# Patient Record
Sex: Female | Born: 1955 | Race: White | Hispanic: No | Marital: Married | Smoking: Never smoker
Health system: Southern US, Community
[De-identification: ages and names within clinical notes are randomized; demographics above are authoritative.]

## PROBLEM LIST (undated history)

## (undated) DIAGNOSIS — I493 Ventricular premature depolarization: Secondary | ICD-10-CM

## (undated) DIAGNOSIS — I10 Essential (primary) hypertension: Secondary | ICD-10-CM

## (undated) DIAGNOSIS — M1990 Unspecified osteoarthritis, unspecified site: Secondary | ICD-10-CM

## (undated) DIAGNOSIS — D649 Anemia, unspecified: Secondary | ICD-10-CM

## (undated) DIAGNOSIS — R51 Headache: Secondary | ICD-10-CM

## (undated) HISTORY — PX: JOINT REPLACEMENT: SHX530

## (undated) HISTORY — PX: TUBAL LIGATION: SHX77

## (undated) HISTORY — PX: BACK SURGERY: SHX140

## (undated) HISTORY — PX: PARATHYROIDECTOMY: SHX19

## (undated) HISTORY — PX: APPENDECTOMY: SHX54

## (undated) HISTORY — PX: FOOT SURGERY: SHX648

## (undated) HISTORY — PX: ABDOMINAL HYSTERECTOMY: SHX81

---

## 2014-01-03 ENCOUNTER — Other Ambulatory Visit: Payer: Self-pay | Admitting: Orthopedic Surgery

## 2014-01-25 ENCOUNTER — Encounter (HOSPITAL_COMMUNITY): Payer: Self-pay | Admitting: Pharmacy Technician

## 2014-01-28 ENCOUNTER — Encounter (HOSPITAL_COMMUNITY): Payer: Self-pay

## 2014-01-28 ENCOUNTER — Encounter (HOSPITAL_COMMUNITY)
Admission: RE | Admit: 2014-01-28 | Discharge: 2014-01-28 | Disposition: A | Discharge status: Home / Self Care | Payer: Medicare HMO | Source: Ambulatory Visit | Attending: Orthopedic Surgery | Admitting: Orthopedic Surgery

## 2014-01-28 DIAGNOSIS — Z01812 Encounter for preprocedural laboratory examination: Secondary | ICD-10-CM | POA: Insufficient documentation

## 2014-01-28 DIAGNOSIS — Z01818 Encounter for other preprocedural examination: Secondary | ICD-10-CM | POA: Insufficient documentation

## 2014-01-28 HISTORY — DX: Unspecified osteoarthritis, unspecified site: M19.90

## 2014-01-28 HISTORY — DX: Ventricular premature depolarization: I49.3

## 2014-01-28 HISTORY — DX: Anemia, unspecified: D64.9

## 2014-01-28 HISTORY — DX: Essential (primary) hypertension: I10

## 2014-01-28 HISTORY — DX: Headache: R51

## 2014-01-28 LAB — URINALYSIS, ROUTINE W REFLEX MICROSCOPIC
BILIRUBIN URINE: NEGATIVE
Glucose, UA: NEGATIVE mg/dL
Hgb urine dipstick: NEGATIVE
Ketones, ur: NEGATIVE mg/dL
LEUKOCYTES UA: NEGATIVE
NITRITE: NEGATIVE
Protein, ur: NEGATIVE mg/dL
SPECIFIC GRAVITY, URINE: 1.018 (ref 1.005–1.030)
UROBILINOGEN UA: 0.2 mg/dL (ref 0.0–1.0)
pH: 5 (ref 5.0–8.0)

## 2014-01-28 LAB — CBC WITH DIFFERENTIAL/PLATELET
BASOS ABS: 0 10*3/uL (ref 0.0–0.1)
BASOS PCT: 1 % (ref 0–1)
EOS ABS: 0.2 10*3/uL (ref 0.0–0.7)
Eosinophils Relative: 4 % (ref 0–5)
HCT: 36.1 % (ref 36.0–46.0)
Hemoglobin: 12.5 g/dL (ref 12.0–15.0)
Lymphocytes Relative: 31 % (ref 12–46)
Lymphs Abs: 1.3 10*3/uL (ref 0.7–4.0)
MCH: 32.5 pg (ref 26.0–34.0)
MCHC: 34.6 g/dL (ref 30.0–36.0)
MCV: 93.8 fL (ref 78.0–100.0)
Monocytes Absolute: 0.3 10*3/uL (ref 0.1–1.0)
Monocytes Relative: 6 % (ref 3–12)
Neutro Abs: 2.5 10*3/uL (ref 1.7–7.7)
Neutrophils Relative %: 58 % (ref 43–77)
PLATELETS: 260 10*3/uL (ref 150–400)
RBC: 3.85 MIL/uL — ABNORMAL LOW (ref 3.87–5.11)
RDW: 12.8 % (ref 11.5–15.5)
WBC: 4.2 10*3/uL (ref 4.0–10.5)

## 2014-01-28 LAB — COMPREHENSIVE METABOLIC PANEL
ALBUMIN: 4.1 g/dL (ref 3.5–5.2)
ALK PHOS: 100 U/L (ref 39–117)
ALT: 29 U/L (ref 0–35)
AST: 29 U/L (ref 0–37)
BUN: 22 mg/dL (ref 6–23)
CHLORIDE: 103 meq/L (ref 96–112)
CO2: 27 mEq/L (ref 19–32)
Calcium: 9.6 mg/dL (ref 8.4–10.5)
Creatinine, Ser: 0.71 mg/dL (ref 0.50–1.10)
GFR calc Af Amer: >90 mL/min (ref >90)
GFR calc non Af Amer: >90 mL/min (ref >90)
Glucose, Bld: 80 mg/dL (ref 70–99)
Potassium: 4.6 mEq/L (ref 3.7–5.3)
SODIUM: 142 meq/L (ref 137–147)
TOTAL PROTEIN: 7.3 g/dL (ref 6.0–8.3)
Total Bilirubin: <0.2 mg/dL — ABNORMAL LOW (ref 0.3–1.2)

## 2014-01-28 LAB — PROTIME-INR
INR: 0.93 (ref 0.00–1.49)
PROTHROMBIN TIME: 12.3 s (ref 11.6–15.2)

## 2014-01-28 LAB — TYPE AND SCREEN
ABO/RH(D): O POS
ANTIBODY SCREEN: NEGATIVE

## 2014-01-28 LAB — APTT: APTT: 33 s (ref 24–37)

## 2014-01-28 LAB — SURGICAL PCR SCREEN
MRSA, PCR: NEGATIVE
STAPHYLOCOCCUS AUREUS: NEGATIVE

## 2014-01-28 NOTE — Pre-Procedure Instructions (Signed)
Alyssa Meza  01/28/2014   Your procedure is scheduled on: Wednesday, February 06, 2014 at 12:09 PM  [facility] (use Main Entrance "A'') at 9:00 AM.  Call this number if you have problems the morning of surgery: [phone]   Remember:   Do not eat food or drink liquids after midnight, Tuesday 02/05/14   Take these medicines the morning of surgery with A SIP OF WATER: carvedilol (COREG) If needed: HYDROcodone-acetaminophen (NORCO for pain  Stop taking Aspirin, Multivitamins and herbal medications. Do not take any NSAIDs ie: Ibuprofen, Advil, Naproxen or any medication containing Aspirin. Stop at least 5 days prior to procedure, 02/01/14.   Do not wear jewelry, make-up or nail polish.  Do not wear lotions, powders, or perfumes. You may wear deodorant.  Do not shave 48 hours prior to surgery.   Do not bring valuables to the hospital.  [facility] is not responsible for any belongings or valuables.               Contacts, dentures or bridgework may not be worn into surgery.  Leave suitcase in the car. After surgery it may be brought to your room.  For patients admitted to the hospital, discharge time is determined by your treatment team.               Patients discharged the day of surgery will not be allowed to drive home.  Name and phone number of your driver:   Special Instructions:  Special Instructions:Special Instructions: [facility] - Preparing for Surgery  Before surgery, you can play an important role.  Because skin is not sterile, your skin needs to be as free of germs as possible.  You can reduce the number of germs on you skin by washing with CHG (chlorahexidine gluconate) soap before surgery.  CHG is an antiseptic cleaner which kills germs and bonds with the skin to continue killing germs even after washing.  Please DO NOT use if you have an allergy to CHG or antibacterial soaps.  If your skin becomes reddened/irritated stop using the CHG and inform your nurse  when you arrive at Short Stay.  Do not shave (including legs and underarms) for at least 48 hours prior to the first CHG shower.  You may shave your face.  Please follow these instructions carefully:   1.  Shower with CHG Soap the night before surgery and the morning of Surgery.  2.  If you choose to wash your hair, wash your hair first as usual with your normal shampoo.  3.  After you shampoo, rinse your hair and body thoroughly to remove the Shampoo.  4.  Use CHG as you would any other liquid soap.  You can apply chg directly  to the skin and wash gently with scrungie or a clean washcloth.  5.  Apply the CHG Soap to your body ONLY FROM THE NECK DOWN.  Do not use on open wounds or open sores.  Avoid contact with your eyes, ears, mouth and genitals (private parts).  Wash genitals (private parts) with your normal soap.  6.  Wash thoroughly, paying special attention to the area where your surgery will be performed.  7.  Thoroughly rinse your body with warm water from the neck down.  8.  DO NOT shower/wash with your normal soap after using and rinsing off the CHG Soap.  9.  Pat yourself dry with a clean towel.  10.  Wear clean pajamas.            11.  Place clean sheets on your bed the night of your first shower and do not sleep with pets.  Day of Surgery  Do not apply any lotions the morning of surgery.  Please wear clean clothes to the hospital/surgery center.   Please read over the following fact sheets that you were given: Pain Booklet, Coughing and Deep Breathing, Blood Transfusion Information, MRSA Information and Surgical Site Infection Prevention

## 2014-01-29 LAB — ABO/RH: ABO/RH(D): O POS

## 2014-01-30 NOTE — Progress Notes (Signed)
Received a return fax from [facility]--"hasn't been seen in office in 3+ yrs" And received office note with ekg- placed in chart.  DA

## 2014-02-05 MED ORDER — CEFAZOLIN SODIUM-DEXTROSE 2-3 GM-% IV SOLR
2.0000 g | INTRAVENOUS | Status: AC
  Administered 2014-02-06: 2 g via INTRAVENOUS
  Filled 2014-02-05: qty 50

## 2014-02-06 ENCOUNTER — Inpatient Hospital Stay (HOSPITAL_COMMUNITY): Payer: Medicare HMO

## 2014-02-06 ENCOUNTER — Encounter (HOSPITAL_COMMUNITY): Payer: Self-pay | Admitting: *Deleted

## 2014-02-06 ENCOUNTER — Inpatient Hospital Stay (HOSPITAL_COMMUNITY)
Admission: RE | Admit: 2014-02-06 | Discharge: 2014-02-07 | DRG: 517 | Disposition: A | Discharge status: Home / Self Care | Payer: Medicare HMO | Source: Ambulatory Visit | Attending: Orthopedic Surgery | Admitting: Orthopedic Surgery

## 2014-02-06 ENCOUNTER — Encounter (HOSPITAL_COMMUNITY)
Admission: RE | Disposition: A | Discharge status: Home / Self Care | Payer: Self-pay | Source: Ambulatory Visit | Attending: Orthopedic Surgery

## 2014-02-06 ENCOUNTER — Inpatient Hospital Stay (HOSPITAL_COMMUNITY): Payer: Medicare HMO | Admitting: Anesthesiology

## 2014-02-06 ENCOUNTER — Encounter (HOSPITAL_COMMUNITY): Payer: Medicare HMO | Admitting: Anesthesiology

## 2014-02-06 DIAGNOSIS — I10 Essential (primary) hypertension: Secondary | ICD-10-CM | POA: Diagnosis present

## 2014-02-06 DIAGNOSIS — D649 Anemia, unspecified: Secondary | ICD-10-CM | POA: Diagnosis present

## 2014-02-06 DIAGNOSIS — M533 Sacrococcygeal disorders, not elsewhere classified: Principal | ICD-10-CM | POA: Diagnosis present

## 2014-02-06 DIAGNOSIS — M129 Arthropathy, unspecified: Secondary | ICD-10-CM | POA: Diagnosis present

## 2014-02-06 HISTORY — PX: SACROILIAC JOINT FUSION: SHX6088

## 2014-02-06 SURGERY — SACROILIAC JOINT FUSION
Anesthesia: General | Laterality: Right

## 2014-02-06 MED ORDER — SODIUM CHLORIDE 0.9 % IV SOLN: 250.0000 mL | INTRAVENOUS | Status: DC

## 2014-02-06 MED ORDER — LIDOCAINE HCL (CARDIAC) 20 MG/ML IV SOLN
INTRAVENOUS | Status: AC
  Filled 2014-02-06: qty 5

## 2014-02-06 MED ORDER — ZOLPIDEM TARTRATE 5 MG PO TABS
5.0000 mg | ORAL_TABLET | Freq: Every day | ORAL | Status: DC
  Administered 2014-02-06: 5 mg via ORAL
  Filled 2014-02-06: qty 1

## 2014-02-06 MED ORDER — DOCUSATE SODIUM 100 MG PO CAPS
100.0000 mg | ORAL_CAPSULE | Freq: Two times a day (BID) | ORAL | Status: DC
  Administered 2014-02-06 (×2): 100 mg via ORAL
  Filled 2014-02-06 (×4): qty 1

## 2014-02-06 MED ORDER — MENTHOL 3 MG MT LOZG
1.0000 | LOZENGE | OROMUCOSAL | Status: DC | PRN
  Filled 2014-02-06: qty 9

## 2014-02-06 MED ORDER — ACETAMINOPHEN 650 MG RE SUPP: 650.0000 mg | RECTAL | Status: DC | PRN

## 2014-02-06 MED ORDER — OXYCODONE HCL 5 MG PO TABS
5.0000 mg | ORAL_TABLET | Freq: Once | ORAL | Status: AC | PRN
  Administered 2014-02-06: 5 mg via ORAL

## 2014-02-06 MED ORDER — MIDAZOLAM HCL 5 MG/5ML IJ SOLN
INTRAMUSCULAR | Status: DC | PRN
  Administered 2014-02-06: 2 mg via INTRAVENOUS

## 2014-02-06 MED ORDER — ADULT MULTIVITAMIN W/MINERALS CH
1.0000 | ORAL_TABLET | Freq: Every day | ORAL | Status: DC
  Administered 2014-02-06: 1 via ORAL
  Filled 2014-02-06 (×2): qty 1

## 2014-02-06 MED ORDER — 0.9 % SODIUM CHLORIDE (POUR BTL) OPTIME
TOPICAL | Status: DC | PRN
  Administered 2014-02-06: 1000 mL

## 2014-02-06 MED ORDER — PROPOFOL 10 MG/ML IV BOLUS
INTRAVENOUS | Status: AC
  Filled 2014-02-06: qty 40

## 2014-02-06 MED ORDER — OXYCODONE HCL 5 MG PO TABS
ORAL_TABLET | ORAL | Status: AC
  Filled 2014-02-06: qty 1

## 2014-02-06 MED ORDER — VITAMIN B-12 100 MCG PO TABS
100.0000 ug | ORAL_TABLET | Freq: Every day | ORAL | Status: DC
  Administered 2014-02-06: 100 ug via ORAL
  Filled 2014-02-06 (×2): qty 1

## 2014-02-06 MED ORDER — NEOSTIGMINE METHYLSULFATE 1 MG/ML IJ SOLN
INTRAMUSCULAR | Status: DC | PRN
  Administered 2014-02-06: 3 mg via INTRAVENOUS

## 2014-02-06 MED ORDER — LACTATED RINGERS IV SOLN
INTRAVENOUS | Status: DC | PRN
  Administered 2014-02-06: 11:00:00 via INTRAVENOUS

## 2014-02-06 MED ORDER — FENTANYL 25 MCG/HR TD PT72
25.0000 ug | MEDICATED_PATCH | TRANSDERMAL | Status: DC
  Filled 2014-02-06: qty 1

## 2014-02-06 MED ORDER — ROCURONIUM BROMIDE 100 MG/10ML IV SOLN
INTRAVENOUS | Status: DC | PRN
  Administered 2014-02-06: 40 mg via INTRAVENOUS

## 2014-02-06 MED ORDER — HYDROMORPHONE HCL PF 1 MG/ML IJ SOLN
0.2500 mg | INTRAMUSCULAR | Status: DC | PRN
  Administered 2014-02-06: 0.5 mg via INTRAVENOUS

## 2014-02-06 MED ORDER — FLEET ENEMA 7-19 GM/118ML RE ENEM
1.0000 | ENEMA | Freq: Once | RECTAL | Status: AC | PRN
  Filled 2014-02-06: qty 1

## 2014-02-06 MED ORDER — BISACODYL 5 MG PO TBEC
5.0000 mg | DELAYED_RELEASE_TABLET | Freq: Every day | ORAL | Status: DC | PRN
  Filled 2014-02-06: qty 1

## 2014-02-06 MED ORDER — LOSARTAN POTASSIUM 50 MG PO TABS
50.0000 mg | ORAL_TABLET | Freq: Every day | ORAL | Status: DC
  Filled 2014-02-06 (×2): qty 1

## 2014-02-06 MED ORDER — GLYCOPYRROLATE 0.2 MG/ML IJ SOLN
INTRAMUSCULAR | Status: AC
  Filled 2014-02-06: qty 2

## 2014-02-06 MED ORDER — DIAZEPAM 5 MG PO TABS
ORAL_TABLET | ORAL | Status: AC
  Filled 2014-02-06: qty 1

## 2014-02-06 MED ORDER — PHENOL 1.4 % MT LIQD: 1.0000 | OROMUCOSAL | Status: DC | PRN

## 2014-02-06 MED ORDER — FENTANYL CITRATE 0.05 MG/ML IJ SOLN
INTRAMUSCULAR | Status: AC
  Filled 2014-02-06: qty 5

## 2014-02-06 MED ORDER — PROPOFOL 10 MG/ML IV BOLUS
INTRAVENOUS | Status: DC | PRN
  Administered 2014-02-06: 150 mg via INTRAVENOUS

## 2014-02-06 MED ORDER — PROMETHAZINE HCL 25 MG/ML IJ SOLN
INTRAMUSCULAR | Status: AC
  Filled 2014-02-06: qty 1

## 2014-02-06 MED ORDER — ONDANSETRON HCL 4 MG/2ML IJ SOLN: 4.0000 mg | INTRAMUSCULAR | Status: DC | PRN

## 2014-02-06 MED ORDER — NEOSTIGMINE METHYLSULFATE 1 MG/ML IJ SOLN
INTRAMUSCULAR | Status: AC
Start: 2014-02-06 — End: 2014-02-06
  Filled 2014-02-06: qty 10

## 2014-02-06 MED ORDER — POVIDONE-IODINE 7.5 % EX SOLN
Freq: Once | CUTANEOUS | Status: AC
  Administered 2014-02-06: 10:00:00 via TOPICAL
  Filled 2014-02-06: qty 118

## 2014-02-06 MED ORDER — OXYCODONE HCL 5 MG/5ML PO SOLN: 5.0000 mg | Freq: Once | ORAL | Status: AC | PRN

## 2014-02-06 MED ORDER — ACETAMINOPHEN 325 MG PO TABS: 650.0000 mg | ORAL_TABLET | ORAL | Status: DC | PRN

## 2014-02-06 MED ORDER — BUPIVACAINE-EPINEPHRINE PF 0.25-1:200000 % IJ SOLN
INTRAMUSCULAR | Status: DC | PRN
  Administered 2014-02-06: 30 mL

## 2014-02-06 MED ORDER — LACTATED RINGERS IV SOLN
INTRAVENOUS | Status: DC
  Administered 2014-02-06: 10:00:00 via INTRAVENOUS

## 2014-02-06 MED ORDER — VITAMIN D 1000 UNITS PO TABS
2000.0000 [IU] | ORAL_TABLET | Freq: Every day | ORAL | Status: DC
  Filled 2014-02-06 (×2): qty 2

## 2014-02-06 MED ORDER — PROMETHAZINE HCL 25 MG/ML IJ SOLN
6.2500 mg | INTRAMUSCULAR | Status: DC | PRN
  Administered 2014-02-06: 6.25 mg via INTRAVENOUS

## 2014-02-06 MED ORDER — CARVEDILOL 6.25 MG PO TABS
6.2500 mg | ORAL_TABLET | Freq: Two times a day (BID) | ORAL | Status: DC
  Administered 2014-02-06 – 2014-02-07 (×2): 6.25 mg via ORAL
  Filled 2014-02-06 (×4): qty 1

## 2014-02-06 MED ORDER — DIAZEPAM 5 MG PO TABS
5.0000 mg | ORAL_TABLET | Freq: Four times a day (QID) | ORAL | Status: DC | PRN
  Administered 2014-02-06 (×2): 5 mg via ORAL
  Filled 2014-02-06: qty 1

## 2014-02-06 MED ORDER — MIDAZOLAM HCL 2 MG/2ML IJ SOLN
INTRAMUSCULAR | Status: AC
  Filled 2014-02-06: qty 2

## 2014-02-06 MED ORDER — ONDANSETRON HCL 4 MG/2ML IJ SOLN
INTRAMUSCULAR | Status: AC
  Filled 2014-02-06: qty 2

## 2014-02-06 MED ORDER — HYDROMORPHONE HCL PF 1 MG/ML IJ SOLN
INTRAMUSCULAR | Status: AC
  Filled 2014-02-06: qty 1

## 2014-02-06 MED ORDER — SENNOSIDES-DOCUSATE SODIUM 8.6-50 MG PO TABS
1.0000 | ORAL_TABLET | Freq: Every evening | ORAL | Status: DC | PRN
  Filled 2014-02-06: qty 1

## 2014-02-06 MED ORDER — SODIUM CHLORIDE 0.9 % IJ SOLN: 3.0000 mL | INTRAMUSCULAR | Status: DC | PRN

## 2014-02-06 MED ORDER — BUPIVACAINE-EPINEPHRINE (PF) 0.25% -1:200000 IJ SOLN
INTRAMUSCULAR | Status: AC
  Filled 2014-02-06: qty 30

## 2014-02-06 MED ORDER — OXYCODONE-ACETAMINOPHEN 5-325 MG PO TABS
2.0000 | ORAL_TABLET | Freq: Three times a day (TID) | ORAL | Status: DC | PRN
  Administered 2014-02-06 (×2): 2 via ORAL
  Filled 2014-02-06 (×2): qty 2

## 2014-02-06 MED ORDER — HYDROMORPHONE HCL PF 1 MG/ML IJ SOLN
0.2500 mg | INTRAMUSCULAR | Status: DC | PRN
  Administered 2014-02-06 (×2): 0.5 mg via INTRAVENOUS

## 2014-02-06 MED ORDER — GLYCOPYRROLATE 0.2 MG/ML IJ SOLN
INTRAMUSCULAR | Status: DC | PRN
  Administered 2014-02-06: 0.4 mg via INTRAVENOUS

## 2014-02-06 MED ORDER — CEFAZOLIN SODIUM 1-5 GM-% IV SOLN
1.0000 g | Freq: Three times a day (TID) | INTRAVENOUS | Status: AC
  Administered 2014-02-06 – 2014-02-07 (×2): 1 g via INTRAVENOUS
  Filled 2014-02-06: qty 50

## 2014-02-06 MED ORDER — SODIUM CHLORIDE 0.9 % IJ SOLN
3.0000 mL | Freq: Two times a day (BID) | INTRAMUSCULAR | Status: DC
  Administered 2014-02-06: 3 mL via INTRAVENOUS

## 2014-02-06 MED ORDER — FENTANYL CITRATE 0.05 MG/ML IJ SOLN
INTRAMUSCULAR | Status: DC | PRN
  Administered 2014-02-06 (×3): 50 ug via INTRAVENOUS
  Administered 2014-02-06: 100 ug via INTRAVENOUS

## 2014-02-06 MED ORDER — ALUM & MAG HYDROXIDE-SIMETH 200-200-20 MG/5ML PO SUSP: 30.0000 mL | Freq: Four times a day (QID) | ORAL | Status: DC | PRN

## 2014-02-06 MED ORDER — ZOLPIDEM TARTRATE 10 MG PO TABS: 5.0000 mg | ORAL_TABLET | Freq: Every day | ORAL | Status: DC

## 2014-02-06 SURGICAL SUPPLY — 56 items
BENZOIN TINCTURE PRP APPL 2/3 (GAUZE/BANDAGES/DRESSINGS) ×2 IMPLANT
BLADE SURG 10 STRL SS (BLADE) ×2 IMPLANT
BLADE SURG 11 STRL SS (BLADE) ×2 IMPLANT
BLADE SURG ROTATE 9660 (MISCELLANEOUS) IMPLANT
CANISTER SUCTION 2500CC (MISCELLANEOUS) ×2 IMPLANT
CAP-I-FUSE IMPLANT SYSTEM ×2 IMPLANT
COVER SURGICAL LIGHT HANDLE (MISCELLANEOUS) ×2 IMPLANT
DRAPE C-ARM 42X72 X-RAY (DRAPES) ×2 IMPLANT
DRAPE C-ARMOR (DRAPES) ×2 IMPLANT
DRAPE INCISE IOBAN 66X45 STRL (DRAPES) ×2 IMPLANT
DRAPE POUCH INSTRU U-SHP 10X18 (DRAPES) ×2 IMPLANT
DRAPE SURG 17X23 STRL (DRAPES) ×8 IMPLANT
DURAPREP 26ML APPLICATOR (WOUND CARE) ×2 IMPLANT
ELECT CAUTERY BLADE 6.4 (BLADE) ×2 IMPLANT
ELECT REM PT RETURN 9FT ADLT (ELECTROSURGICAL) ×2
ELECTRODE REM PT RTRN 9FT ADLT (ELECTROSURGICAL) ×1 IMPLANT
GAUZE SPONGE 4X4 16PLY XRAY LF (GAUZE/BANDAGES/DRESSINGS) ×2 IMPLANT
GLOVE BIO SURGEON STRL SZ7 (GLOVE) ×4 IMPLANT
GLOVE BIO SURGEON STRL SZ8 (GLOVE) ×2 IMPLANT
GLOVE BIOGEL PI IND STRL 6.5 (GLOVE) ×1 IMPLANT
GLOVE BIOGEL PI IND STRL 7.0 (GLOVE) ×1 IMPLANT
GLOVE BIOGEL PI IND STRL 8 (GLOVE) ×1 IMPLANT
GLOVE BIOGEL PI INDICATOR 6.5 (GLOVE) ×1
GLOVE BIOGEL PI INDICATOR 7.0 (GLOVE) ×1
GLOVE BIOGEL PI INDICATOR 8 (GLOVE) ×1
GLOVE SURG SS PI 6.5 STRL IVOR (GLOVE) ×2 IMPLANT
GOWN STRL REUS W/ TWL LRG LVL3 (GOWN DISPOSABLE) ×2 IMPLANT
GOWN STRL REUS W/ TWL XL LVL3 (GOWN DISPOSABLE) ×1 IMPLANT
GOWN STRL REUS W/TWL LRG LVL3 (GOWN DISPOSABLE) ×2
GOWN STRL REUS W/TWL XL LVL3 (GOWN DISPOSABLE) ×1
KIT BASIN OR (CUSTOM PROCEDURE TRAY) ×2 IMPLANT
KIT ROOM TURNOVER OR (KITS) ×2 IMPLANT
MANIFOLD NEPTUNE II (INSTRUMENTS) IMPLANT
NEEDLE 22X1 1/2 (OR ONLY) (NEEDLE) ×2 IMPLANT
NEEDLE HYPO 25GX1X1/2 BEV (NEEDLE) ×2 IMPLANT
NS IRRIG 1000ML POUR BTL (IV SOLUTION) ×2 IMPLANT
PACK UNIVERSAL I (CUSTOM PROCEDURE TRAY) ×2 IMPLANT
PAD ARMBOARD 7.5X6 YLW CONV (MISCELLANEOUS) ×4 IMPLANT
PENCIL BUTTON HOLSTER BLD 10FT (ELECTRODE) ×2 IMPLANT
SPONGE GAUZE 4X4 12PLY (GAUZE/BANDAGES/DRESSINGS) ×2 IMPLANT
SPONGE LAP 18X18 X RAY DECT (DISPOSABLE) ×2 IMPLANT
STAPLER VISISTAT 35W (STAPLE) ×2 IMPLANT
STRIP CLOSURE SKIN 1/2X4 (GAUZE/BANDAGES/DRESSINGS) IMPLANT
STRIP CLOSURE SKIN 1/4X3 (GAUZE/BANDAGES/DRESSINGS) ×2 IMPLANT
SUT MNCRL AB 4-0 PS2 18 (SUTURE) ×2 IMPLANT
SUT VIC AB 0 CT1 18XCR BRD 8 (SUTURE) ×1 IMPLANT
SUT VIC AB 0 CT1 8-18 (SUTURE) ×1
SUT VIC AB 2-0 CT2 18 VCP726D (SUTURE) ×2 IMPLANT
SYR BULB IRRIGATION 50ML (SYRINGE) ×2 IMPLANT
SYR CONTROL 10ML LL (SYRINGE) ×2 IMPLANT
TAPE CLOTH SURG 4X10 WHT LF (GAUZE/BANDAGES/DRESSINGS) ×2 IMPLANT
TOWEL OR 17X24 6PK STRL BLUE (TOWEL DISPOSABLE) ×2 IMPLANT
TOWEL OR 17X26 10 PK STRL BLUE (TOWEL DISPOSABLE) ×2 IMPLANT
TUBE CONNECTING 12X1/4 (SUCTIONS) ×2 IMPLANT
WATER STERILE IRR 1000ML POUR (IV SOLUTION) ×2 IMPLANT
YANKAUER SUCT BULB TIP NO VENT (SUCTIONS) ×2 IMPLANT

## 2014-02-06 NOTE — Anesthesia Postprocedure Evaluation (Signed)
  Anesthesia Post-op Note  Patient: Alyssa Meza  Procedure(s) Performed: Procedure(s) with comments: SACROILIAC JOINT FUSION (Right) - Right sided sacroiliac joint fusion  Patient Location: PACU  Anesthesia Type:General  Level of Consciousness: awake and alert   Airway and Oxygen Therapy: Patient Spontanous Breathing  Post-op Pain: moderate  Post-op Assessment: Post-op Vital signs reviewed  Post-op Vital Signs: stable  Last Vitals:  Filed Vitals:   02/06/14 1430  BP: 110/54  Pulse: 52  Temp:   Resp: 12    Complications: No apparent anesthesia complications

## 2014-02-06 NOTE — Anesthesia Procedure Notes (Signed)
Procedure Name: Intubation Date/Time: 02/06/2014 11:05 AM Performed by: Coralee RudFLORES, Emely Fahy Pre-anesthesia Checklist: Patient identified, Emergency Drugs available, Suction available and Patient being monitored Patient Re-evaluated:Patient Re-evaluated prior to inductionOxygen Delivery Method: Circle system utilized Preoxygenation: Pre-oxygenation with 100% oxygen Intubation Type: IV induction Ventilation: Mask ventilation without difficulty Laryngoscope Size: Mac and 3 Grade View: Grade I Tube type: Oral Tube size: 7.5 mm Number of attempts: 1 (Intubated by Paramedic student easily, atraumatically) Airway Equipment and Method: Stylet Placement Confirmation: ETT inserted through vocal cords under direct vision,  positive ETCO2 and breath sounds checked- equal and bilateral Secured at: 23 cm Tube secured with: Tape Dental Injury: Teeth and Oropharynx as per pre-operative assessment

## 2014-02-06 NOTE — H&P (Signed)
PREOPERATIVE H&P  Chief Complaint: R low back pain  HPI: Alyssa Meza is a 58 y.o. female who presents with ongoing low back pain. Exam c/w right SI dysfunction. Patient did report substantial temporary relief with right SI injection.   Past Medical History  Diagnosis Date  . Hypertension   . PVC's (premature ventricular contractions)     Hx: of occasional  . Headache(784.0)   . Arthritis   . Anemia    Past Surgical History  Procedure Laterality Date  . Appendectomy    . Abdominal hysterectomy    . Parathyroidectomy    . Back surgery      times 4  . Joint replacement      right hip  . Foot surgery      Hx: of right foot  . Tubal ligation     History   Social History  . Marital Status: Married    Spouse Name: N/A    Number of Children: N/A  . Years of Education: N/A   Social History Main Topics  . Smoking status: Never Smoker   . Smokeless tobacco: Never Used  . Alcohol Use: No  . Drug Use: No  . Sexual Activity: Not on file   Other Topics Concern  . Not on file   Social History Narrative  . No narrative on file   Family History  Problem Relation Age of Onset  . Coronary artery disease Mother   . Hypertension Mother   . Bipolar disorder Mother   . Renal Disease Mother   . Coronary artery disease Father   . Hypertension Father   . Heart attack Father   . Diabetes Father   . Hypertension Sister    No Known Allergies Prior to Admission medications   Medication Sig Start Date End Date Taking? Authorizing Provider  carvedilol (COREG) 6.25 MG tablet Take 6.25 mg by mouth 2 (two) times daily with a meal.   Yes Historical Provider, MD  Cholecalciferol (VITAMIN D3) 2000 UNITS TABS Take 2,000 Units by mouth daily.   Yes Historical Provider, MD  cyanocobalamin 1000 MCG tablet Take 100 mcg by mouth daily.   Yes Historical Provider, MD  fentaNYL (DURAGESIC - DOSED MCG/HR) 25 MCG/HR patch Place 25 mcg onto the skin every other day.   Yes Historical Provider, MD   HYDROcodone-acetaminophen (NORCO) 10-325 MG per tablet Take 1 tablet by mouth 3 (three) times daily as needed (for pain).   Yes Historical Provider, MD  losartan (COZAAR) 50 MG tablet Take 50 mg by mouth daily.   Yes Historical Provider, MD  Multiple Vitamin (MULTIVITAMIN WITH MINERALS) TABS tablet Take 1 tablet by mouth daily.   Yes Historical Provider, MD  Nutritional Supplements (ESTROVEN MAXIMUM STRENGTH PO) Take 1 tablet by mouth daily.   Yes Historical Provider, MD  zolpidem (AMBIEN) 10 MG tablet Take 10 mg by mouth at bedtime.   Yes Historical Provider, MD     All other systems have been reviewed and were otherwise negative with the exception of those mentioned in the HPI and as above.  Physical Exam: There were no vitals filed for this visit.  General: Alert, no acute distress Cardiovascular: No pedal edema Respiratory: No cyanosis, no use of accessory musculature GI: No organomegaly, abdomen is soft and non-tender Skin: No lesions in the area of chief complaint Neurologic: Sensation intact distally Psychiatric: Patient is competent for consent with normal mood and affect Lymphatic: No axillary or cervical lymphadenopathy  MUSCULOSKELETAL: + TTP right low back  Assessment/Plan: Right sided sacroiliac joint dysfunction Plan for Procedure(s): SACROILIAC JOINT FUSION   Emilee HeroMark Leonard Danzig Macgregor, MD 02/06/2014 7:22 AM

## 2014-02-06 NOTE — Anesthesia Preprocedure Evaluation (Signed)
Anesthesia Evaluation  Patient identified by MRN, date of birth, ID band Patient awake    Reviewed: Allergy & Precautions, H&P , NPO status , reviewed documented beta blocker date and time   Airway Mallampati: I  Neck ROM: Full    Dental  (+) Teeth Intact   Pulmonary  breath sounds clear to auscultation        Cardiovascular hypertension, Rhythm:Regular Rate:Normal     Neuro/Psych  Headaches,    GI/Hepatic   Endo/Other    Renal/GU      Musculoskeletal  (+) Arthritis -,   Abdominal   Peds  Hematology  (+) anemia ,   Anesthesia Other Findings   Reproductive/Obstetrics                           Anesthesia Physical Anesthesia Plan  ASA: II  Anesthesia Plan: General   Post-op Pain Management:    Induction: Intravenous  Airway Management Planned: Oral ETT  Additional Equipment:   Intra-op Plan:   Post-operative Plan: Extubation in OR  Informed Consent: I have reviewed the patients History and Physical, chart, labs and discussed the procedure including the risks, benefits and alternatives for the proposed anesthesia with the patient or authorized representative who has indicated his/her understanding and acceptance.   Dental advisory given  Plan Discussed with: CRNA and Surgeon  Anesthesia Plan Comments:         Anesthesia Quick Evaluation

## 2014-02-06 NOTE — Op Note (Signed)
NAMLandry Mellow:  [name], [name]                 ACCOUNT NO.:  [id number]  MEDICAL RECORD NO.:  [id number]  LOCATION:  3C02C                        FACILITY:  [facility]  PHYSICIAN:  Estill BambergMark Trenika Hudson, MD      DATE OF BIRTH:  Sep 15, 1956  DATE OF PROCEDURE:  02/06/2014                              OPERATIVE REPORT   PREOPERATIVE DIAGNOSIS:  Right sacroiliac joint dysfunction.  POSTOPERATIVE DIAGNOSIS:  Right sacroiliac joint dysfunction.  PROCEDURE:  Right-sided sacroiliac joint fusion using the  iFuse sacroiliac joint fusion system.  SURGEON:  Estill BambergMark Srihaan Mastrangelo, MD  ASSISTANT:  Jason CoopKayla McKenzie, PA-C  ANESTHESIA:  General endotracheal anesthesia.  COMPLICATIONS:  None.  DISPOSITION:  Stable.  ESTIMATED BLOOD LOSS:  Minimal.  INDICATIONS FOR PROCEDURE:  Briefly, Ms. Alyssa Meza is a very pleasant [age]- year-old female who did present to me with ongoing pain at the right side and low back.  The patient did have a history of sacroiliac joint injections and she did report 75% improvement in the pain after her injections.  She did also have a radiofrequency ablation procedure, but her pain did recur.  We did discuss the pros and cons of proceeding with this procedure reflected above and the patient did elect to proceed.  OPERATIVE DETAILS:  On February 06, 2014, the patient was brought to surgery and general endotracheal anesthesia was administered.  The patient was placed prone on a well-padded flat Jackson bed.  Gel was placed in the patient's chest and hips.  Antibiotics were given and a time-out procedure was performed.  The region of the right buttock was prepped and draped in the usual sterile fashion.  I then made a 3-cm incision overlying the posterior border of the sacrum.  Three  guidewires were advanced across the sacroiliac joint, 2 above the S1 foramen and 1 below it.  I did liberally use lateral inlet and outlet radiographs.  I then drilled and broached over the guidewires.  I then placed iFuse  implants over the guidewires, 7 mm in diameter at the appropriate length.  I was very pleased with the press-fit of each of the implants.  The guidewires were then removed.  The wound was then copiously irrigated.  The wound was then closed with 0 Vicryl, followed by 2-0 Vicryl, and the skin was closed using 4-0 Monocryl.  Benzoin and Steri-Strips were applied followed by sterile dressing.  Jason CoopKayla McKenzie was my assistant throughout surgery and aided in retraction, suctioning, and closure.     Estill BambergMark Addisyn Leclaire, MD     MD/MEDQ  D:  02/06/2014  T:  02/06/2014  Job:  [id number]

## 2014-02-06 NOTE — Transfer of Care (Signed)
Immediate Anesthesia Transfer of Care Note   Patient: Alyssa Meza  Procedure(s) Performed: Procedure(s) with comments: SACROILIAC JOINT FUSION (Right) - Right sided sacroiliac joint fusion  Patient Location: PACU  Anesthesia Type:General  Level of Consciousness: awake, alert , oriented and patient cooperative  Airway & Oxygen Therapy: Patient Spontanous Breathing and Patient connected to nasal cannula oxygen  Post-op Assessment: Report given to PACU RN, Post -op Vital signs reviewed and stable and Patient moving all extremities  Post vital signs: Reviewed and stable  Complications: No apparent anesthesia complications

## 2014-02-07 ENCOUNTER — Encounter (HOSPITAL_COMMUNITY): Payer: Self-pay | Admitting: Orthopedic Surgery

## 2014-02-07 MED ORDER — OXYCODONE-ACETAMINOPHEN 5-325 MG PO TABS
2.0000 | ORAL_TABLET | ORAL | Status: DC | PRN
  Administered 2014-02-07 (×2): 2 via ORAL
  Filled 2014-02-07 (×2): qty 2

## 2014-02-07 NOTE — Progress Notes (Signed)
Patient doing well. Moderate expected LBP.  BP 117/60  Pulse 69  Temp(Src) 98.1 F (36.7 C) (Oral)  Resp 18  Ht 5\' 3"  (1.6 m)  Wt 57.607 kg (127 lb)  BMI 22.50 kg/m2  SpO2 100%  Dressing in place NVI  - d/c home today - f/u 2 weeks - TCWB on right

## 2014-02-07 NOTE — Progress Notes (Signed)
Pt doing well. Pt given D/C instructions with Rx's, verbal understanding of teaching was given by Pt. Pt's IV was D/C'd prior to D/C. Pt D/C'd home via wheelchair @ [phone] per MD order. Pt is stable @ D/C and has no other needs. Rema FendtAshley Misaki Sozio, RN

## 2014-03-27 NOTE — Discharge Summary (Signed)
   Patient ID: Alyssa Meza MRN: [id number] DOB/AGE: [birth year] 58 y.o.  Admit date: 02/06/2014 Discharge date: 02/07/2014  Admission Diagnoses:  Active Problems:   Sacroiliac joint dysfunction Right  Discharge Diagnoses:  Same  Past Medical History  Diagnosis Date  . Hypertension   . PVC's (premature ventricular contractions)     Hx: of occasional  . Headache(784.0)   . Arthritis   . Anemia     Surgeries: Procedure(s): R SACROILIAC JOINT FUSION on 02/06/2014   Consultants:  None  Discharged Condition: Improved  Hospital Course: Alyssa Meza is an 58 y.o. female who was admitted 02/06/2014 for operative treatment of R SIJ Dysfunction. Patient has severe unremitting pain that affects sleep, daily activities, and work/hobbies. After pre-op clearance the patient was taken to the operating room on 02/06/2014 and underwent  Procedure(s): R SACROILIAC JOINT FUSION.    Patient was given perioperative antibiotics:  Anti-infectives   Start     Dose/Rate Route Frequency Ordered Stop   02/06/14 1545  ceFAZolin (ANCEF) IVPB 1 g/50 mL premix     1 g 100 mL/hr over 30 Minutes Intravenous Every 8 hours 02/06/14 1353 02/07/14 0034   02/06/14 0600  ceFAZolin (ANCEF) IVPB 2 g/50 mL premix     2 g 100 mL/hr over 30 Minutes Intravenous On call to O.R. 02/05/14 1438 02/06/14 1118       Patient was given sequential compression devices, early ambulation, and chemoprophylaxis to prevent DVT.  Patient benefited maximally from hospital stay and there were no complications.    Recent vital signs: BP 133/81  Pulse 72  Temp(Src) 99 F (37.2 C) (Oral)  Resp 20  Ht 5\' 3"  (1.6 m)  Wt 57.607 kg (127 lb)  BMI 22.50 kg/m2  SpO2 96%    Discharge Medications:     Medication List    STOP taking these medications       HYDROcodone-acetaminophen 10-325 MG per tablet  Commonly known as:  NORCO      TAKE these medications       carvedilol 6.25 MG tablet  Commonly known as:  COREG  Take 6.25 mg  by mouth 2 (two) times daily with a meal.     cyanocobalamin 1000 MCG tablet  Take 100 mcg by mouth daily.     ESTROVEN MAXIMUM STRENGTH PO  Take 1 tablet by mouth daily.     fentaNYL 25 MCG/HR patch  Commonly known as:  DURAGESIC - dosed mcg/hr  Place 25 mcg onto the skin every other day.     losartan 50 MG tablet  Commonly known as:  COZAAR  Take 50 mg by mouth daily.     multivitamin with minerals Tabs tablet  Take 1 tablet by mouth daily.     Vitamin D3 2000 UNITS Tabs  Take 2,000 Units by mouth daily.     zolpidem 10 MG tablet  Commonly known as:  AMBIEN  Take 10 mg by mouth at bedtime.        Diagnostic Studies: No results found.  Disposition: 01-Home or Self Care      Discharge Instructions   Discharge patient    Complete by:  As directed            - d/c home today  - f/u 2 weeks in office  - TCWB on right  Signed: Georga Bora 03/27/2014, 2:49 PM

## 2015-04-25 ENCOUNTER — Other Ambulatory Visit (HOSPITAL_COMMUNITY): Payer: Self-pay | Admitting: Orthopedic Surgery

## 2015-04-25 DIAGNOSIS — Z96649 Presence of unspecified artificial hip joint: Principal | ICD-10-CM

## 2015-04-25 DIAGNOSIS — T84038S Mechanical loosening of other internal prosthetic joint, sequela: Secondary | ICD-10-CM

## 2015-05-02 ENCOUNTER — Encounter (HOSPITAL_COMMUNITY): Payer: Commercial Managed Care - HMO

## 2015-05-13 ENCOUNTER — Encounter (HOSPITAL_COMMUNITY): Payer: Commercial Managed Care - HMO

## 2015-06-04 ENCOUNTER — Other Ambulatory Visit (HOSPITAL_COMMUNITY): Payer: Self-pay | Admitting: Orthopedic Surgery

## 2015-06-04 DIAGNOSIS — M545 Low back pain: Secondary | ICD-10-CM

## 2015-06-13 ENCOUNTER — Encounter (HOSPITAL_COMMUNITY): Payer: Commercial Managed Care - HMO

## 2015-06-18 ENCOUNTER — Encounter (HOSPITAL_COMMUNITY): Payer: Medicare HMO

## 2015-06-18 ENCOUNTER — Encounter (HOSPITAL_COMMUNITY)
Admission: RE | Admit: 2015-06-18 | Discharge: 2015-06-18 | Disposition: A | Discharge status: Home / Self Care | Payer: Medicare HMO | Source: Ambulatory Visit | Attending: Orthopedic Surgery | Admitting: Orthopedic Surgery

## 2015-06-18 ENCOUNTER — Ambulatory Visit (HOSPITAL_COMMUNITY)
Admission: RE | Admit: 2015-06-18 | Discharge: 2015-06-18 | Disposition: A | Discharge status: Home / Self Care | Payer: Medicare HMO | Source: Ambulatory Visit | Attending: Orthopedic Surgery | Admitting: Orthopedic Surgery

## 2015-06-18 DIAGNOSIS — M545 Low back pain: Secondary | ICD-10-CM | POA: Diagnosis not present

## 2015-06-18 DIAGNOSIS — N949 Unspecified condition associated with female genital organs and menstrual cycle: Secondary | ICD-10-CM | POA: Insufficient documentation

## 2015-06-18 DIAGNOSIS — G8929 Other chronic pain: Secondary | ICD-10-CM | POA: Diagnosis not present

## 2015-06-18 DIAGNOSIS — Z96649 Presence of unspecified artificial hip joint: Principal | ICD-10-CM

## 2015-06-18 DIAGNOSIS — Z96641 Presence of right artificial hip joint: Secondary | ICD-10-CM | POA: Diagnosis not present

## 2015-06-18 DIAGNOSIS — M25551 Pain in right hip: Secondary | ICD-10-CM | POA: Insufficient documentation

## 2015-06-18 DIAGNOSIS — T84038S Mechanical loosening of other internal prosthetic joint, sequela: Secondary | ICD-10-CM

## 2015-06-18 DIAGNOSIS — Z981 Arthrodesis status: Secondary | ICD-10-CM | POA: Insufficient documentation

## 2015-06-18 MED ORDER — TECHNETIUM TC 99M MEDRONATE IV KIT
26.3000 | PACK | Freq: Once | INTRAVENOUS | Status: AC | PRN
  Administered 2015-06-18: 26.3 via INTRAVENOUS

## 2016-10-23 IMAGING — NM NM BONE 3 PHASE
2 series · 12 of 12 positions shown · non-contrast
Comparison: Plain film 08/30/2012

CLINICAL DATA: RIGHT total hip arthroplasty July 2013. RIGHT
hip pain radiating to the mid RIGHT thigh. No recent trauma.

EXAM:
NUCLEAR MEDICINE 3-PHASE BONE SCAN
TECHNIQUE: Radionuclide angiographic images, immediate static blood pool
images, and 3-hour delayed static images were obtained of the hips
after intravenous injection of radiopharmaceutical.
RADIOPHARMACEUTICALS:  26.3 mCi 2c-55m MDP

[Series 1: flow · 4.14mm/px · 6 of 40 frames shown (1 of 2)]
[frame 4/40  full-range]
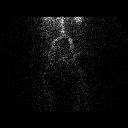
[frame 10/40  full-range]
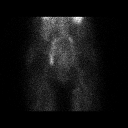
[frame 17/40  full-range]
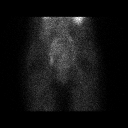
[frame 24/40  full-range]
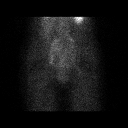
[frame 30/40  full-range]
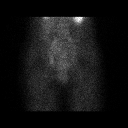
[frame 37/40  full-range]
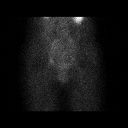

[Series 1: flow · 4.14mm/px · 6 of 40 frames shown (2 of 2)]
[frame 4/40  full-range]
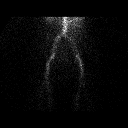
[frame 10/40  full-range]
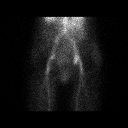
[frame 17/40  full-range]
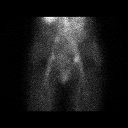
[frame 24/40  full-range]
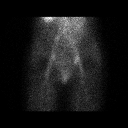
[frame 30/40  full-range]
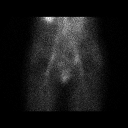
[frame 37/40  full-range]
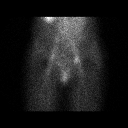

[12 of 12 positions shown; findings below may reference images not displayed]

FINDINGS: Vascular phase: No asymmetric or increased blood flow to the LEFT or
RIGHT hip.

Blood pool phase: No abnormal blood pool activity in the pelvis.

Delayed phase: Photopenia noted at the RIGHT femoral head consistent
with prosthetic. No abnormal uptake associated with the prosthetic.
IMPRESSION: No evidence loosening infection of the RIGHT hip prosthetic. No
evidence trauma within the pelvis.
